# Patient Record
Sex: Female | Born: 1995 | Race: White | Hispanic: No | Marital: Single | Smoking: Never smoker
Health system: Southern US, Community
[De-identification: ages and names within clinical notes are randomized; demographics above are authoritative.]

## PROBLEM LIST (undated history)

## (undated) DIAGNOSIS — J45909 Unspecified asthma, uncomplicated: Secondary | ICD-10-CM

## (undated) DIAGNOSIS — N12 Tubulo-interstitial nephritis, not specified as acute or chronic: Secondary | ICD-10-CM

## (undated) HISTORY — PX: WISDOM TOOTH EXTRACTION: SHX21

---
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
## 2017-07-02 IMAGING — CT CT RENAL STONE PROTOCOL
2 of 4 series · 16 of 46 positions shown, 18 images · non-contrast
Comparison: None.

CLINICAL DATA: Acute onset right-sided flank and abdominal pain.
Nausea and vomiting. Initial encounter.

EXAM:
CT ABDOMEN AND PELVIS WITHOUT CONTRAST
TECHNIQUE: Multidetector CT imaging of the abdomen and pelvis was performed
following the standard protocol without IV contrast.

[Series 2: stone study 5.0 i30f 1 · axial · 0.65mm/px · z∈[-491,-61]mm · 13 of 94 slices shown, 15 images]
[im 4/94  soft-tissue]
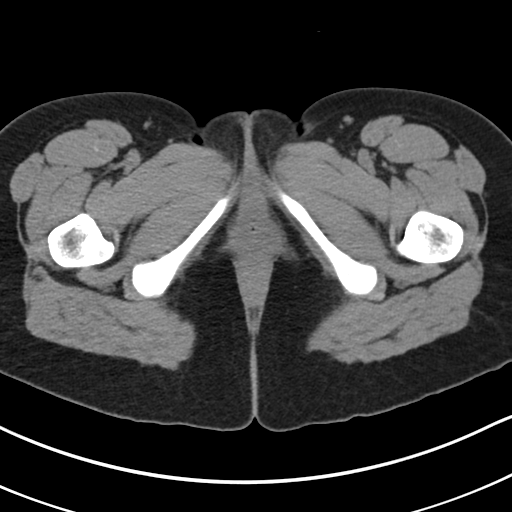
[im 4/94  bone]
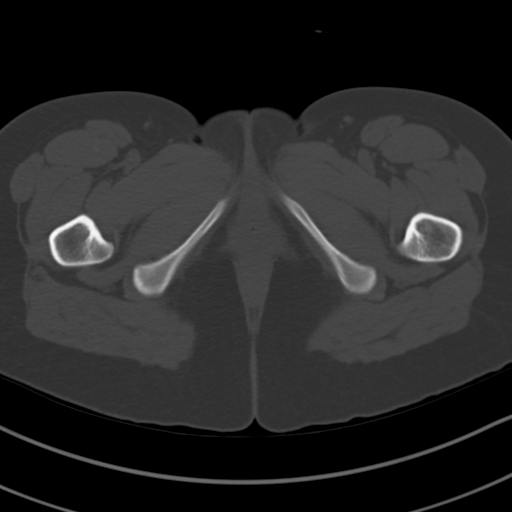
[im 12/94  soft-tissue]
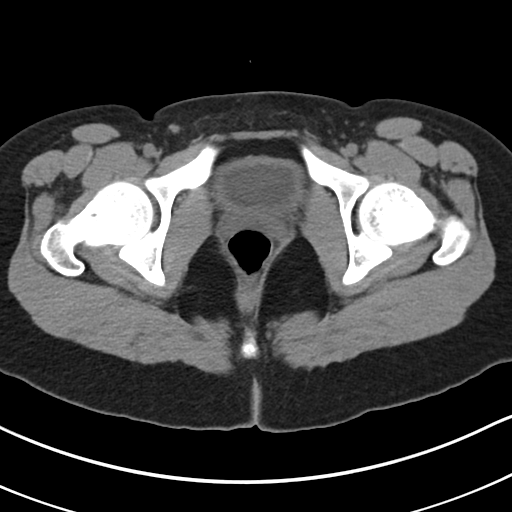
[im 19/94  soft-tissue]
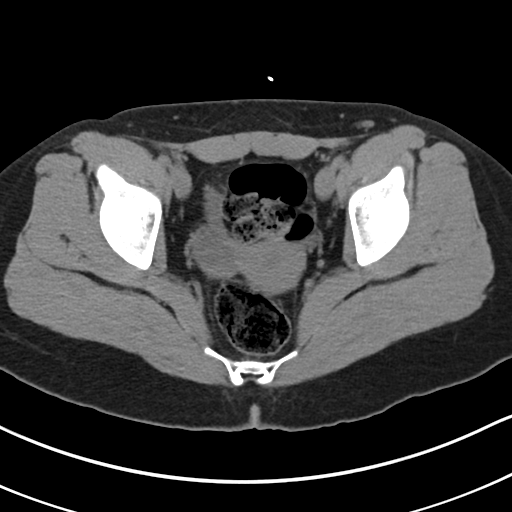
[im 27/94  soft-tissue]
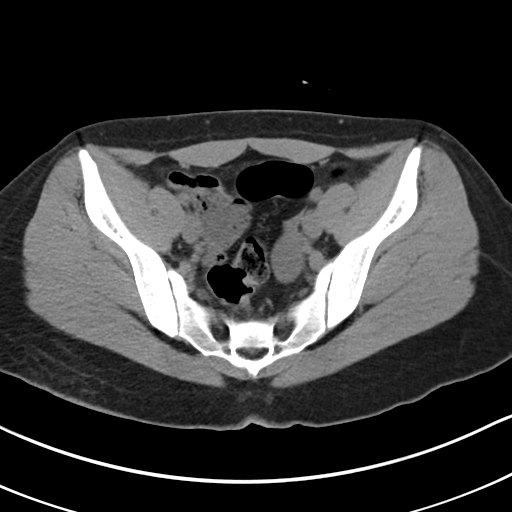
[im 34/94  soft-tissue]
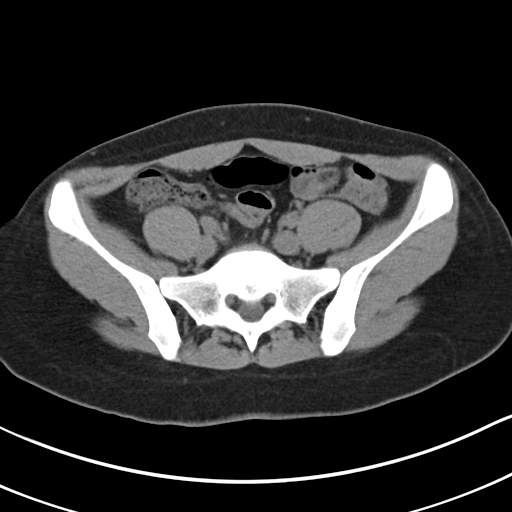
[im 41/94  soft-tissue]
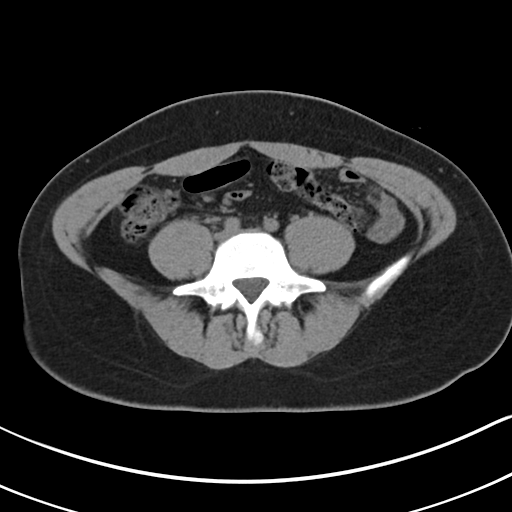
[im 49/94  soft-tissue]
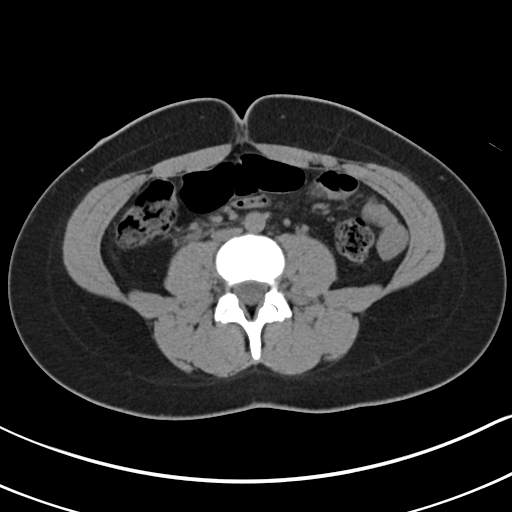
[im 53/94  soft-tissue]
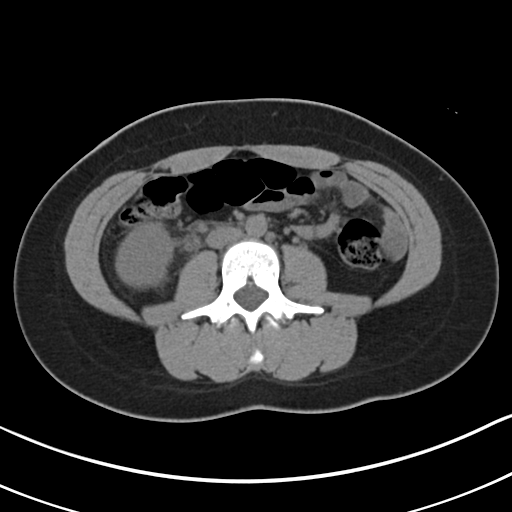
[im 60/94  soft-tissue]
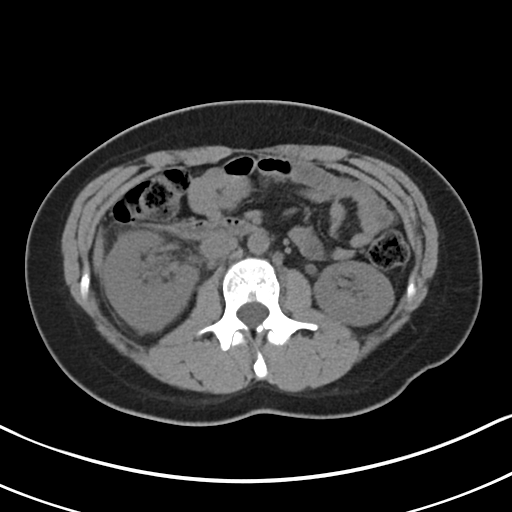
[im 60/94  bone]
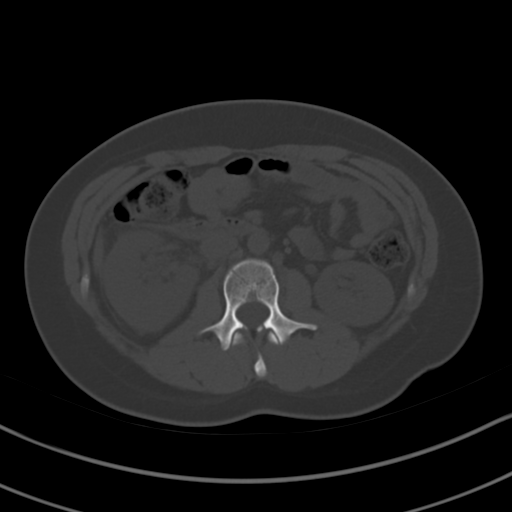
[im 67/94  soft-tissue]
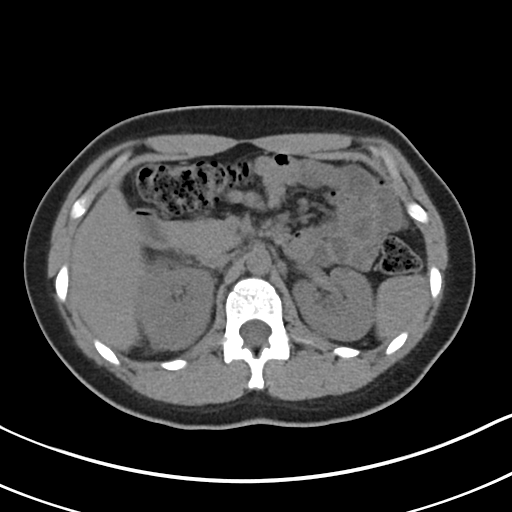
[im 75/94  soft-tissue]
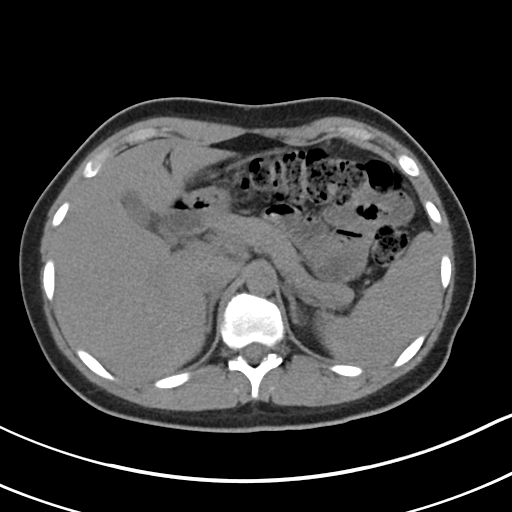
[im 82/94  soft-tissue]
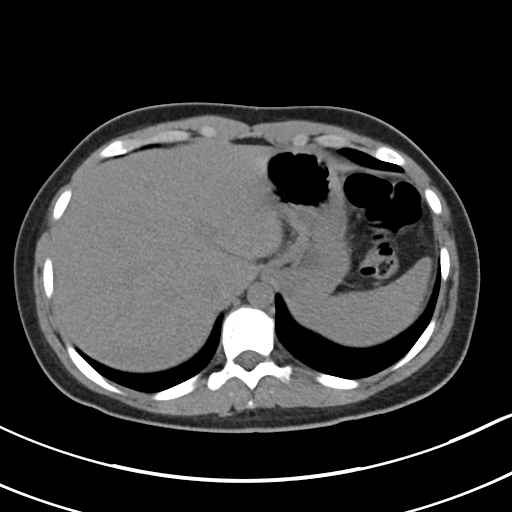
[im 90/94  soft-tissue]
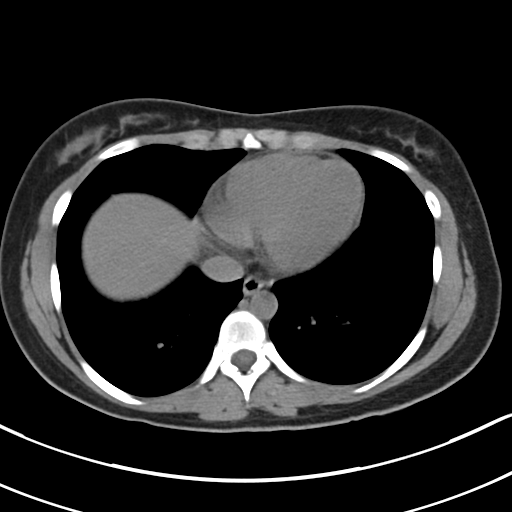

[Series 5: coronal soft tissue · coronal · 0.71mm/px · 3 of 72 slices shown]
[im 24/72  soft-tissue]
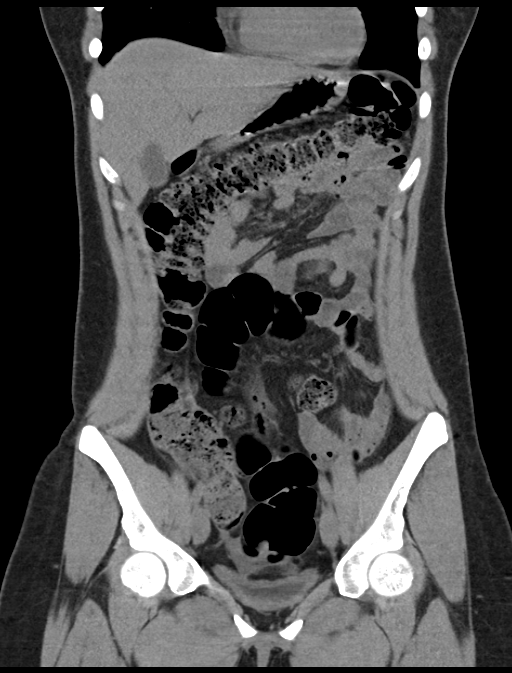
[im 32/72  soft-tissue]
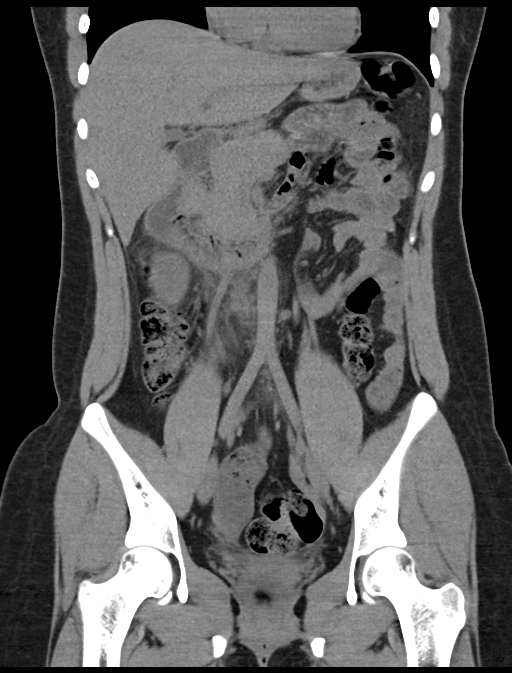
[im 40/72  soft-tissue]
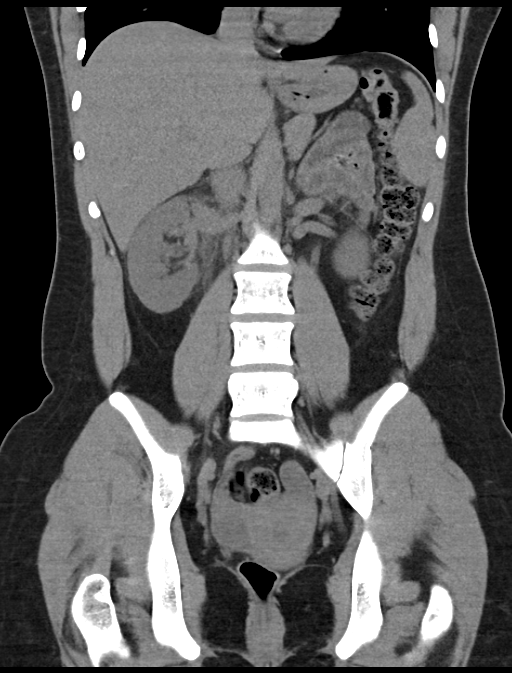

[16 of 46 positions shown; findings below may reference images not displayed]

FINDINGS: The visualized lung bases are clear.

The liver and spleen are unremarkable in appearance. The gallbladder
is within normal limits. The pancreas and adrenal glands are
unremarkable.

There is asymmetric prominence of the right kidney, with right-sided
perinephric stranding, and apparent wall thickening along the right
renal pelvis and proximal right ureters. Two right-sided ureters are
seen. Soft tissue inflammation tracks along the proximal right
ureters. There is no evidence of hydronephrosis. This may reflect
right-sided pyelonephritis.

No renal or ureteral stones are identified. The left kidney is
unremarkable in appearance.

No free fluid is identified. The small bowel is unremarkable in
appearance. The stomach is within normal limits. No acute vascular
abnormalities are seen.

The appendix is normal in caliber, without evidence for
appendicitis. The colon is grossly unremarkable in appearance.

The bladder is decompressed and not well assessed. The uterus is
grossly unremarkable. The ovaries are relatively symmetric. No
suspicious adnexal masses are seen. No inguinal lymphadenopathy is
seen.

No acute osseous abnormalities are identified.
IMPRESSION: 1. No evidence of hydronephrosis.
2. Asymmetric prominence of the right kidney, with right-sided
perinephric stranding, and apparent wall thickening along the right
renal pelvis and proximal right ureters. Two right-sided ureters
seen. Soft tissue inflammation tracks along the proximal right
ureters. This is concerning for right-sided pyelonephritis, given
the patient's symptoms.

## 2017-07-02 IMAGING — CR DG CHEST 2V
2 series · 2 of 2 positions shown · non-contrast
Comparison: None.

CLINICAL DATA: Acute onset of right lateral chest pain and fever.
Initial encounter.

EXAM:
CHEST  2 VIEW

[chest pa]
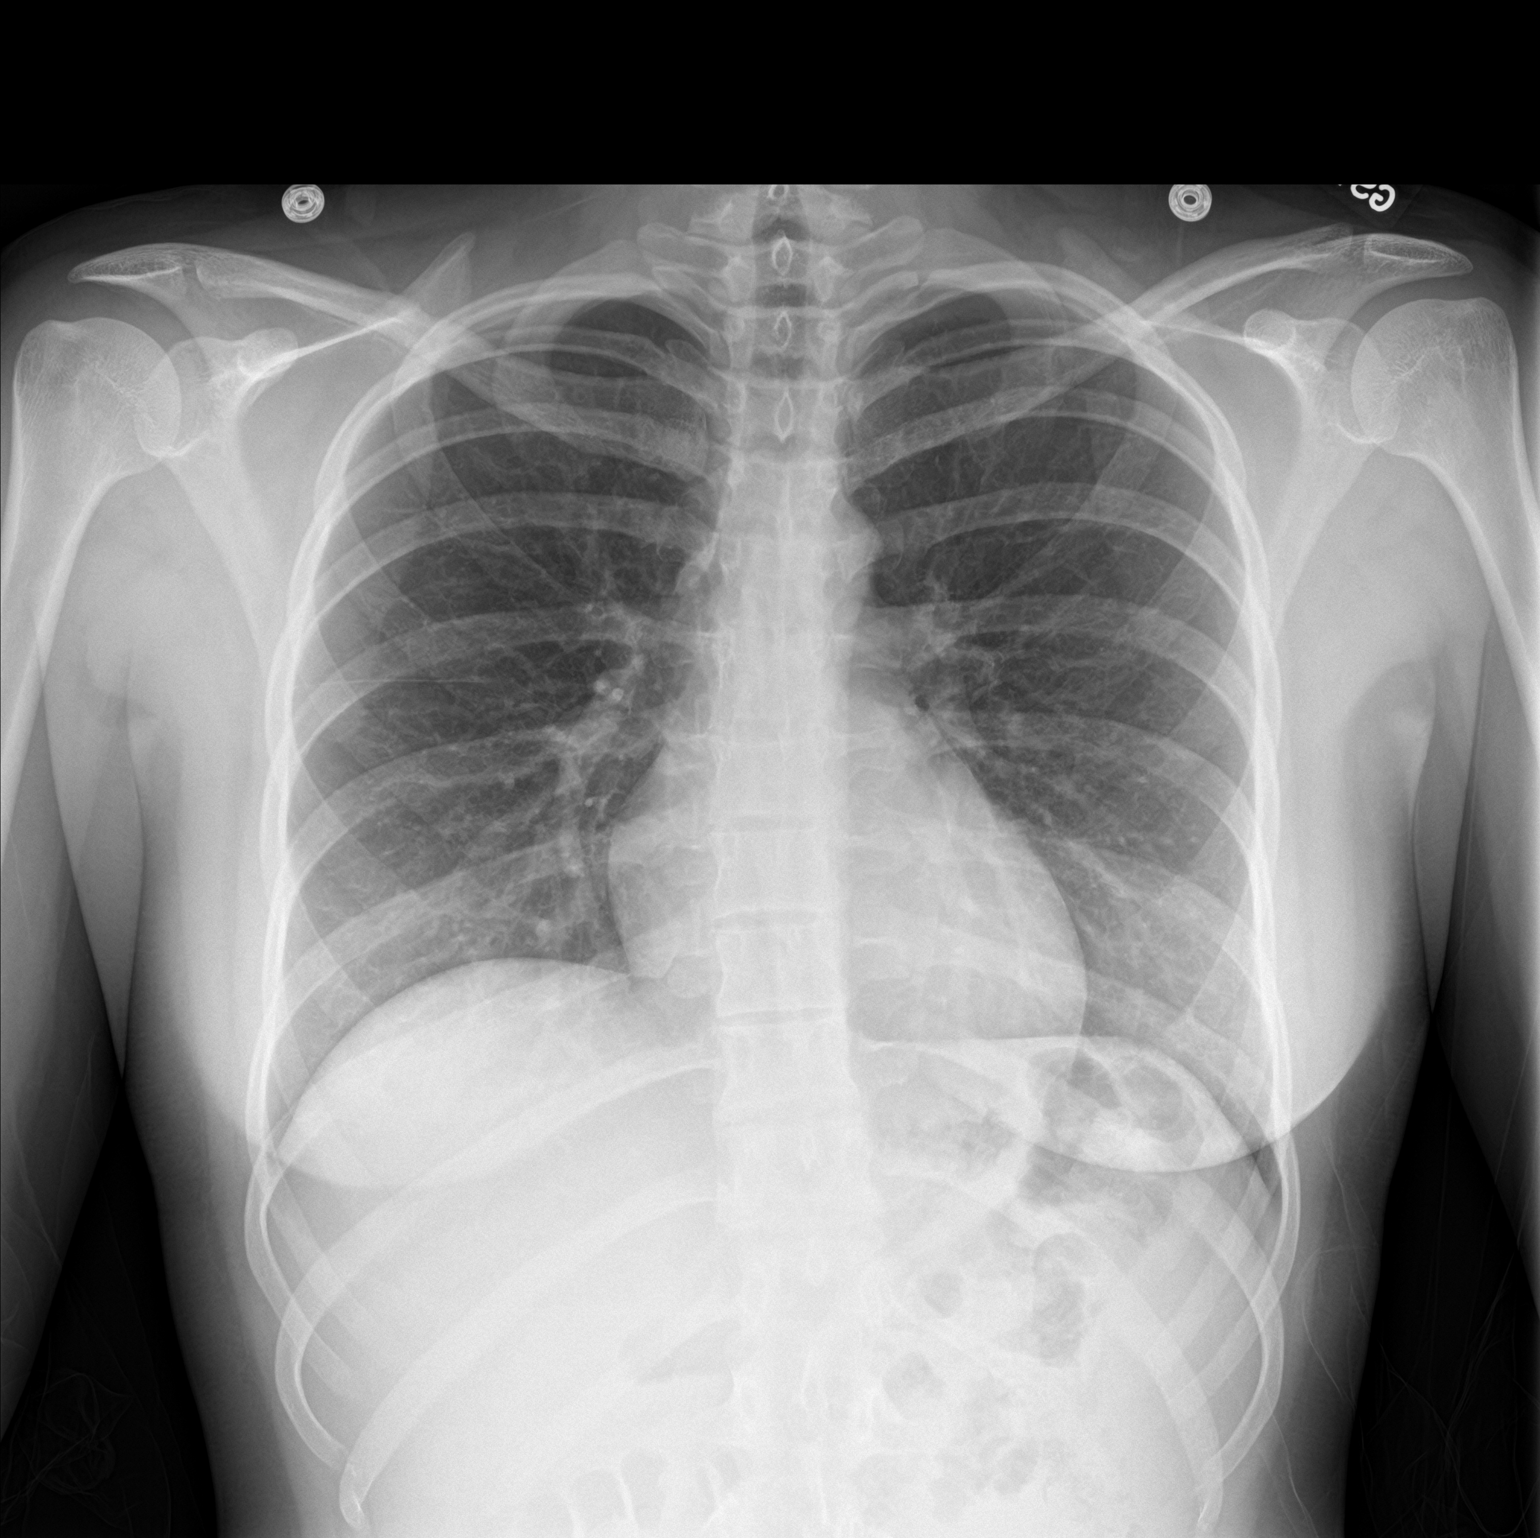

[chest lat]
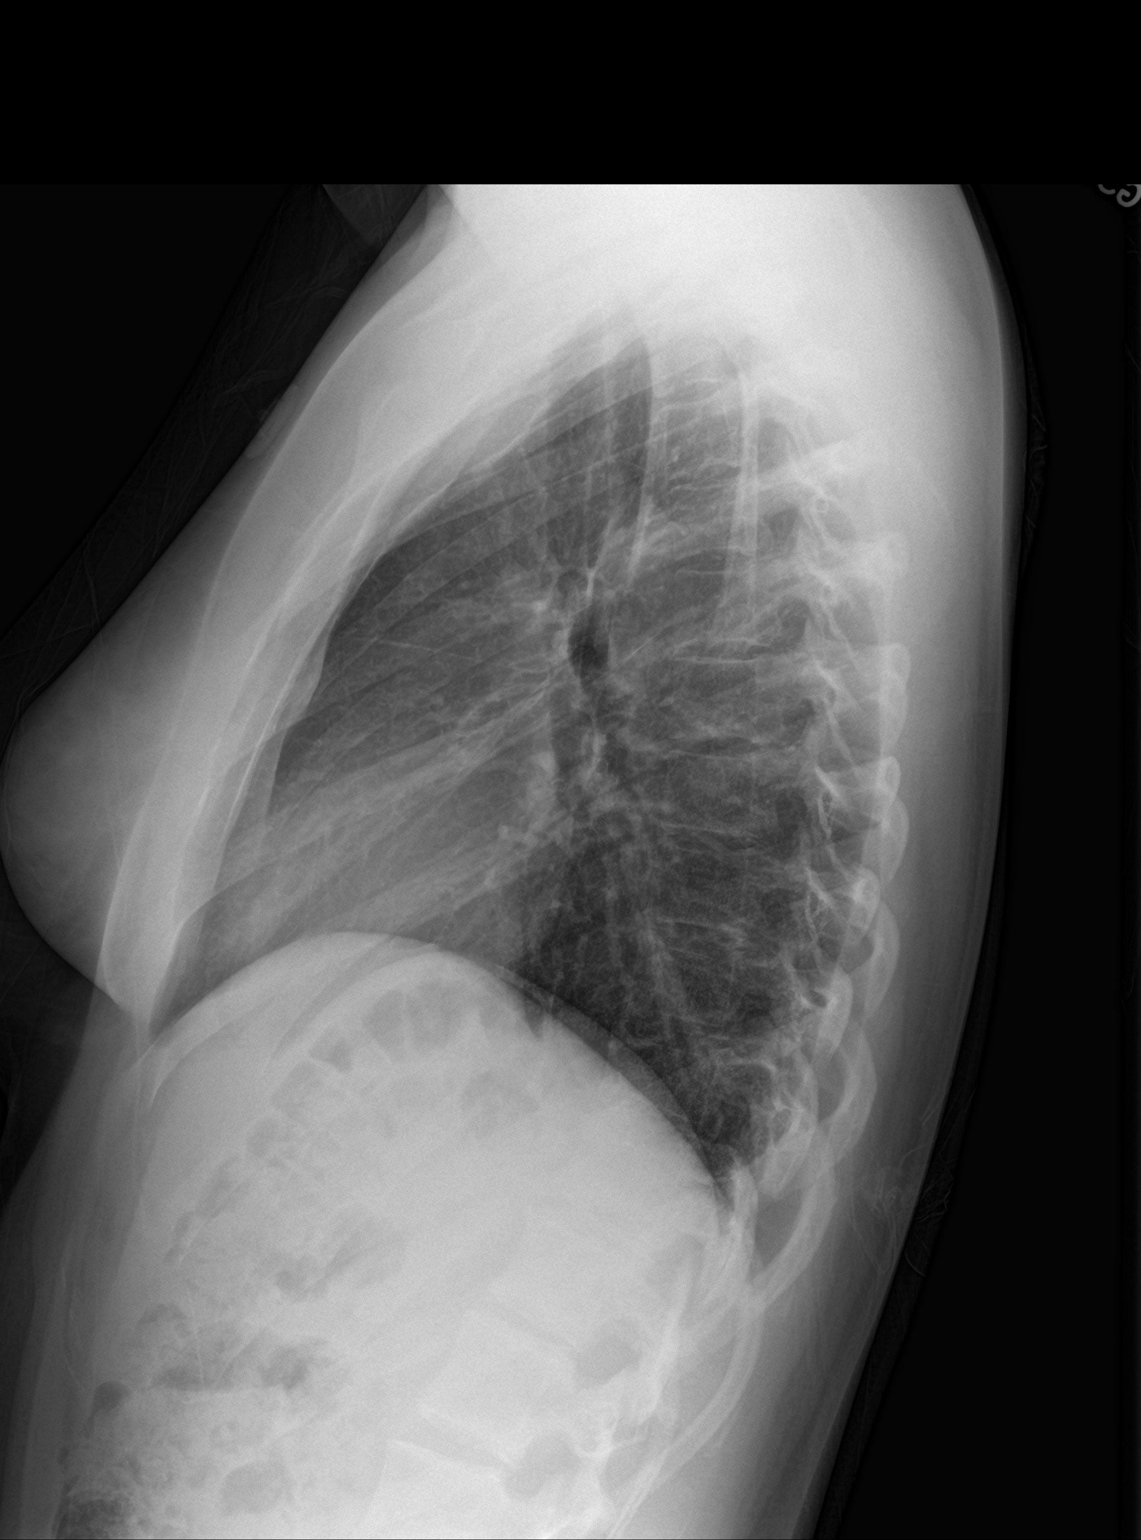

[2 of 2 positions shown; findings below may reference images not displayed]

FINDINGS: The lungs are well-aerated and clear. There is no evidence of focal
opacification, pleural effusion or pneumothorax.

The heart is normal in size; the mediastinal contour is within
normal limits. No acute osseous abnormalities are seen.
IMPRESSION: No acute cardiopulmonary process seen. No displaced rib fractures
identified.
